# Patient Record
Sex: Male | Born: 1990 | Race: White | Hispanic: No | Marital: Single | Smoking: Never smoker
Health system: Southern US, Community
[De-identification: ages and names within clinical notes are randomized; demographics above are authoritative.]

## PROBLEM LIST (undated history)

## (undated) DIAGNOSIS — R569 Unspecified convulsions: Secondary | ICD-10-CM

## (undated) DIAGNOSIS — R51 Headache: Secondary | ICD-10-CM

## (undated) HISTORY — DX: Headache: R51

## (undated) HISTORY — DX: Unspecified convulsions: R56.9

---
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
## 2013-05-29 ENCOUNTER — Telehealth: Payer: Self-pay

## 2013-05-29 NOTE — Telephone Encounter (Signed)
Happy lvm stating that pt was going to be home from school on Thursday and Friday . After that, he will be gone again. Mom said they just received the DMV forms in the mail and would like to know if Inetta Fermo would fill them out. Please call mom at [phone].

## 2013-05-29 NOTE — Telephone Encounter (Signed)
Called mom and scheduled appt for Thursday at 8:45 am. Told her to be sure he brought the forms with him.

## 2013-05-29 NOTE — Telephone Encounter (Signed)
I will need to see Nathanuel to complete the [facility] form as I have not seen him since April 2012. I have 2 openings on Thursday, if he wants to come in at one of those times. Please check with Mom and see. Thanks, Inetta Fermo

## 2013-05-31 ENCOUNTER — Ambulatory Visit: Payer: Self-pay | Admitting: Family

## 2013-05-31 ENCOUNTER — Encounter: Payer: Self-pay | Admitting: Family

## 2013-05-31 ENCOUNTER — Ambulatory Visit (INDEPENDENT_AMBULATORY_CARE_PROVIDER_SITE_OTHER): Payer: BC Managed Care – PPO | Admitting: Family

## 2013-05-31 VITALS — BP 118/64 | HR 60 | Ht 72.0 in | Wt 151.8 lb

## 2013-05-31 DIAGNOSIS — A879 Viral meningitis, unspecified: Secondary | ICD-10-CM

## 2013-05-31 DIAGNOSIS — G40109 Localization-related (focal) (partial) symptomatic epilepsy and epileptic syndromes with simple partial seizures, not intractable, without status epilepticus: Secondary | ICD-10-CM

## 2013-05-31 DIAGNOSIS — G43809 Other migraine, not intractable, without status migrainosus: Secondary | ICD-10-CM
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
## 2013-06-01 ENCOUNTER — Encounter: Payer: Self-pay | Admitting: Family

## 2013-06-01 DIAGNOSIS — G40109 Localization-related (focal) (partial) symptomatic epilepsy and epileptic syndromes with simple partial seizures, not intractable, without status epilepticus: Secondary | ICD-10-CM | POA: Insufficient documentation

## 2013-06-01 DIAGNOSIS — G43809 Other migraine, not intractable, without status migrainosus: Secondary | ICD-10-CM | POA: Insufficient documentation

## 2013-06-01 DIAGNOSIS — A879 Viral meningitis, unspecified: Secondary | ICD-10-CM | POA: Insufficient documentation

## 2013-06-01 NOTE — Patient Instructions (Addendum)
I will complete your DMV form and send it to the [facility]. I will send a copy to you as well.  Plan to return for follow up when you receive another DMV form, or sooner if needed.

## 2013-11-28 ENCOUNTER — Telehealth: Payer: Self-pay

## 2013-11-28 NOTE — Telephone Encounter (Signed)
Please let Mom know that he can take any of the "new" allergy medications such as Claritin, Allegra, Zyrtec. Usually taking the medication at bedtime is best in case it makes him sleepy. Thanks, Inetta Fermoina

## 2013-11-28 NOTE — Telephone Encounter (Signed)
Bryce Wheeler, mom, lvm stating that pt normally gets allergies around this time of year. She wants to know what would be safe for him to take. She said that she knows not to give Benadryl. Please advise and I will call mom back at [phone].

## 2013-11-28 NOTE — Telephone Encounter (Signed)
Called mom and lvm letting her know the below information.

## 2021-07-12 ENCOUNTER — Encounter: Payer: Self-pay | Admitting: Emergency Medicine

## 2021-07-12 ENCOUNTER — Emergency Department (HOSPITAL_BASED_OUTPATIENT_CLINIC_OR_DEPARTMENT_OTHER)
Admission: EM | Admit: 2021-07-12 | Discharge: 2021-07-12 | Disposition: A | Discharge status: Home / Self Care | Payer: No Typology Code available for payment source | Attending: Emergency Medicine | Admitting: Emergency Medicine

## 2021-07-12 ENCOUNTER — Other Ambulatory Visit: Payer: Self-pay

## 2021-07-12 ENCOUNTER — Encounter (HOSPITAL_BASED_OUTPATIENT_CLINIC_OR_DEPARTMENT_OTHER): Payer: Self-pay

## 2021-07-12 DIAGNOSIS — K2100 Gastro-esophageal reflux disease with esophagitis, without bleeding: Secondary | ICD-10-CM | POA: Insufficient documentation

## 2021-07-12 DIAGNOSIS — R220 Localized swelling, mass and lump, head: Secondary | ICD-10-CM | POA: Diagnosis present

## 2021-07-12 DIAGNOSIS — R0989 Other specified symptoms and signs involving the circulatory and respiratory systems: Secondary | ICD-10-CM

## 2021-07-12 DIAGNOSIS — R569 Unspecified convulsions: Secondary | ICD-10-CM | POA: Insufficient documentation

## 2021-07-12 DIAGNOSIS — R198 Other specified symptoms and signs involving the digestive system and abdomen: Secondary | ICD-10-CM

## 2021-07-12 MED ORDER — OMEPRAZOLE 20 MG PO CPDR: 20.0000 mg | DELAYED_RELEASE_CAPSULE | Freq: Every day | ORAL | 2 refills | Status: DC

## 2021-07-12 MED ORDER — FAMOTIDINE 20 MG PO TABS: 20.0000 mg | ORAL_TABLET | Freq: Two times a day (BID) | ORAL | 2 refills | Status: DC

## 2021-07-12 NOTE — ED Triage Notes (Addendum)
Pt is present for increasing left sided throat swelling x two days. Pt has had this ongoing issue for appx one month where he will have "numbness and food gets stuck in his throat". Pt states it is not painful but is concerned about decreased sensation of the left side of his throat. Pt has also had fullness of the left ear. Denies difficulty breathing or difficulty swallowing.

## 2021-07-12 NOTE — Discharge Instructions (Addendum)
I recommended you schedule a follow up appointment with the GI (stomach) specialists for your significant reflux/heartburn.  This can be irritating your throat as well.  I placed a referral to their office - if you don't hear from them in 2 business days, call the number above for [facility] to schedule an appointment.  You should also schedule an appointment with the ENT doctors regarding your enlarged tonsils and chronic ear pressure.  We talked about changing your diet habits.  Try to avoid eating or drinking anything but water at least 3 hours before bedtime.  Try to dilute coffee with milk and drink only 1 cup per day.  Try to avoid very spicy food and alcohol, which can be a stomach irritant.

## 2021-07-12 NOTE — ED Provider Notes (Signed)
[facility] EMERGENCY DEPT Provider Note   CSN: [id number] Arrival date & time: 07/12/21  3976     History Chief Complaint  Patient presents with   Oral Swelling    Bryce Wheeler is a 30 y.o. male presenting to emergency department with complaint of throat discomfort.  The patient reports has been having this issue intermittently for many months.  He feels a get worse in the past 3 days.  He states he does suffer from significant reflux, often feels "acid washing up into the back of my mouth."  He says he intermittently gets a feeling that there is something stuck in the back of his throat, or numbness involving the left posterior side of his throat.  There is a feeling of something stuck when he swallows.  He denies choking, denies vomiting.  Report sometimes he gets a sensation of fullness in his left ear.  He does occasionally use chewing tobacco but has recently stopped.  He does have a history of seizures and is on 2 antiepileptics for that, and reports he has not had a seizure in many years.  He does not take any medications for his reflux aside from occasional Tums.  He does report that he drinks a lot of coffee, enjoys drinking beer as well, often snacks late at night before bedtime.  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 No pertinent surgical history.     No family history on file.  Social History   Tobacco Use   Smoking status: Never   Smokeless tobacco: Never  Substance Use Topics   Alcohol use: Yes    Comment: socially   Drug use: No    Home Medications Prior to  Admission medications   Medication Sig Start Date End Date Taking? Authorizing Provider  famotidine (PEPCID) 20 MG tablet Take 1 tablet (20 mg total) by mouth 2 (two) times daily. Take 30 minutes before breakfast and dinner 07/12/21 08/11/21 Yes Pelagia Iacobucci, Kermit Balo, MD  omeprazole (PRILOSEC) 20 MG capsule Take 1 capsule (20 mg total) by mouth daily. 07/12/21 08/11/21 Yes Richey Doolittle, Kermit Balo, MD    Allergies    Dilantin [phenytoin]  Review of Systems   Review of Systems  Constitutional:  Negative for chills and fever.  HENT:  Positive for ear pain, sore throat and trouble swallowing. Negative for ear discharge and voice change.   Respiratory:  Negative for cough and shortness of breath.   Cardiovascular:  Negative for chest pain.  Gastrointestinal:  Negative for abdominal pain, diarrhea, nausea and vomiting.  Skin:  Negative for color change and rash.  Neurological:  Negative for syncope and headaches.  All other systems reviewed and are negative.  Physical Exam Updated Vital Signs BP (!) 157/92 (BP Location: Right Arm)   Pulse 98   Temp 98.1 F (36.7 C) (Oral)   Resp 17   Ht 6' (1.829 m)   Wt 99.8 kg   SpO2 99%   BMI 29.84 kg/m   Physical Exam Constitutional:      General: He is not in acute distress. HENT:     Head:  Normocephalic and atraumatic.     Right Ear: Hearing, tympanic membrane, ear canal and external ear normal. Tympanic membrane is not perforated or erythematous.     Left Ear: Hearing, tympanic membrane, ear canal and external ear normal. Tympanic membrane is not perforated or erythematous.     Mouth/Throat:     Lips: Pink.     Mouth: Mucous membranes are moist.     Dentition: Normal dentition.     Tongue: No lesions.     Palate: No mass and lesions.     Pharynx: Oropharynx is clear. Uvula midline.     Tonsils: No tonsillar exudate or tonsillar abscesses. 3+ on the right. 3+ on the left.  Eyes:     Extraocular Movements: Extraocular movements intact.      Conjunctiva/sclera: Conjunctivae normal.     Pupils: Pupils are equal, round, and reactive to light.  Neck:     Thyroid: No thyroid mass.  Cardiovascular:     Rate and Rhythm: Normal rate and regular rhythm.  Pulmonary:     Effort: Pulmonary effort is normal. No respiratory distress.     Breath sounds: No stridor.  Musculoskeletal:     Cervical back: Neck supple. No rigidity or tenderness.  Lymphadenopathy:     Cervical: No cervical adenopathy.  Skin:    General: Skin is warm and dry.  Neurological:     General: No focal deficit present.     Mental Status: He is alert. Mental status is at baseline.  Psychiatric:        Mood and Affect: Mood normal.        Behavior: Behavior normal.    ED Results / Procedures / Treatments   Labs (all labs ordered are listed, but only abnormal results are displayed) Labs Reviewed - No data to display  EKG None  Radiology No results found.  Procedures Procedures   Medications Ordered in ED Medications - No data to display  ED Course  I have reviewed the triage vital signs and the nursing notes.  Pertinent labs & imaging results that were available during my care of the patient were reviewed by me and considered in my medical decision making (see chart for details).  Differential diagnosis includes reflux gastritis and esophagitis is most likely cause versus globus sensation versus viral illness vs other  I do not see evidence of peritonsillar abscess or Ludwick's angina.  Uvula is midline.  He does have enlarged tonsils bilaterally, which I suspect is chronic.  He reports that he snores at night.  I suspect there is probably some obstructive apnea as well.  Given that he also has chronic ear pain and congestion, I advised that he see an ENT specialist for this issue.    I do not see any oropharyngeal lesions to suggest that there is this cancerous, but strongly advised he refrain from chewing tobacco.  Did not see evidence of acute ear  infection.  I suggested antihistamines if he continues to have a sensation of ear fullness.  Finally, we talked about his gastritis.  He talked about dietary changes and lifestyle changes.  Given the degree of discomfort that he has on daily basis, I do think it is reasonable to initiate medications, I will start him on omeprazole as well as Pepcid.  I will place referral to gastroenterology, as he may benefit from endoscopy and H Pylori testing if this is persistent.  He verbalized understanding and agreement with this plan.    Final Clinical Impression(s) /  ED Diagnoses Final diagnoses:  Globus sensation  Gastroesophageal reflux disease with esophagitis without hemorrhage    Rx / DC Orders ED Discharge Orders          Ordered    Ambulatory referral to Gastroenterology       Comments: Significant reflux gastritis & esophagitis   07/12/21 0718    omeprazole (PRILOSEC) 20 MG capsule  Daily        07/12/21 0720    famotidine (PEPCID) 20 MG tablet  2 times daily        07/12/21 0720             Terald Sleeper, MD 07/12/21 [phone]

## 2021-07-16 ENCOUNTER — Encounter: Payer: Self-pay | Admitting: Gastroenterology

## 2021-08-14 ENCOUNTER — Ambulatory Visit: Payer: No Typology Code available for payment source | Admitting: Gastroenterology

## 2021-08-18 ENCOUNTER — Encounter: Payer: Self-pay | Admitting: Gastroenterology

## 2021-08-18 ENCOUNTER — Ambulatory Visit: Payer: No Typology Code available for payment source | Admitting: Gastroenterology

## 2021-08-18 VITALS — BP 140/88 | HR 81 | Ht 72.0 in | Wt 240.0 lb

## 2021-08-18 DIAGNOSIS — K219 Gastro-esophageal reflux disease without esophagitis: Secondary | ICD-10-CM | POA: Diagnosis not present

## 2021-08-18 DIAGNOSIS — R0989 Other specified symptoms and signs involving the circulatory and respiratory systems: Secondary | ICD-10-CM

## 2021-08-18 NOTE — Patient Instructions (Addendum)
If you are age 28 or older, your body mass index should be between 23-30. Your Body mass index is 32.55 kg/m. If this is out of the aforementioned range listed, please consider follow up with your Primary Care Provider.  If you are age 79 or younger, your body mass index should be between 19-25. Your Body mass index is 32.55 kg/m. If this is out of the aformentioned range listed, please consider follow up with your Primary Care Provider.   ________________________________________________________  The [facility] providers would like to encourage you to use [facility] to communicate with providers for non-urgent requests or questions.  Due to long hold times on the telephone, sending your provider a message by [facility] may be a faster and more efficient way to get a response.  Please allow 48 business hours for a response.  Please remember that this is for non-urgent requests.  _______________________________________________________  Bonita Quin have been scheduled for an endoscopy. Please follow written instructions given to you at your visit today. If you use inhalers (even only as needed), please bring them with you on the day of your procedure.  Please stop the famotidine.     It was a pleasure to see you today!  Thank you for trusting me with your gastrointestinal care!

## 2021-08-18 NOTE — Progress Notes (Signed)
[facility] Consult Note:  History: Bryce Wheeler 08/18/2021  Referring provider: Pcp, No  Reason for consult/chief complaint: Heartburn (Had a procedure at his ENT)   Subjective  HPI: [facility]21: "Bryce Wheeler is a 30 y.o. male presenting to emergency department with complaint of throat discomfort.  The patient reports has been having this issue intermittently for many months.  He feels a get worse in the past 3 days.  He states he does suffer from significant reflux, often feels "acid washing up into the back of my mouth."  He says he intermittently gets a feeling that there is something stuck in the back of his throat, or numbness involving the left posterior side of his throat.  There is a feeling of something stuck when he swallows.  He denies choking, denies vomiting.  Report sometimes he gets a sensation of fullness in his left ear.   He does occasionally use chewing tobacco but has recently stopped.   He does have a history of seizures and is on 2 antiepileptics for that, and reports he has not had a seizure in many years.   He does not take any medications for his reflux aside from occasional Tums.  He does report that he drinks a lot of coffee, enjoys drinking beer as well, often snacks late at night before bedtime."  Exam that they reportedly normal except for enlarged tonsils.  Patient was referred to ENT, was seen by Jodelle Redharlotta Nordbladh, [facility].  Diagnosis was LPR, laryngoscopy performed with following findings: "After adequate topical anesthetic was applied, 4 mm flexible laryngoscope was passed through the left nasal cavity without difficulty. Flexible laryngoscopy shows patent anterior nasal cavity with minimal crusting, no discharge or infection. Cobblestoning of the nasopharyngeal mucosa.  Normal base of tongue and supraglottis Normal vocal cord mobility without vocal cord  nodule, mass, polyp or tumor. Hypopharynx normal without mass, pooling of secretions or aspiration. Mild post-glottic edema otherwise unremarkable exam. " ________________________________  Bryce Wheeler describes about 2 years of a nonexertional central dull chest pain that he believes may be GERD related.  It typically occurs in the evening after a long day of work, often when he has eaten very little most of the day because he is busy at work.  Pain right radiate to the back sometimes posterior neck.  It is often on an empty stomach, he has occasional regurgitation of bile or liquid stomach contents.  There is a feeling of lump in the throat that has not improved much despite improvement in the chest discomfort starting omeprazole.  About 2 years ago he went to this job working for the [employer] department in [place], he has an irregular schedule and works long hours, has fast food more often than he would like, eats late in the evening and has gained a significant amount of weight by his estimation. He denies dysphagia or odynophagia, nausea or vomiting.  After seeing ENT, he redosed the PPI from morning to evening and again, there has been significant improvement in the chest pain with that.   ROS:  Review of Systems  Constitutional:  Negative for appetite change and unexpected weight change.  HENT:  Negative for mouth sores and voice change.   Eyes:  Negative for pain and redness.  Respiratory:  Negative for cough and shortness of breath.   Cardiovascular:  Positive for chest pain. Negative for palpitations.  Genitourinary:  Negative for dysuria and  hematuria.  Musculoskeletal:  Negative for arthralgias and myalgias.  Skin:  Negative for pallor and rash.  Neurological:  Negative for weakness and headaches.  Hematological:  Negative for adenopathy.    Past Medical History: Past Medical History:  Diagnosis Date   Headache(784.0)    Seizures ([place])      Past Surgical History: History reviewed.  No pertinent surgical history.   Family History: History reviewed. No pertinent family history.  Social History: Social History   Socioeconomic History   Marital status: Single    Spouse name: Not on file   Number of children: Not on file   Years of education: Not on file   Highest education level: Not on file  Occupational History   Not on file  Tobacco Use   Smoking status: Never   Smokeless tobacco: Never  Substance and Sexual Activity   Alcohol use: Yes    Comment: socially   Drug use: No   Sexual activity: Not on file  Other Topics Concern   Not on file  Social History Narrative   Not on file   Social Determinants of Health   Financial Resource Strain: Not on file  Food Insecurity: Not on file  Transportation Needs: Not on file  Physical Activity: Not on file  Stress: Not on file  Social Connections: Not on file    Allergies: Allergies  Allergen Reactions   Dilantin [Phenytoin] Rash    Outpatient Meds: Current Outpatient Medications  Medication Sig Dispense Refill   Lacosamide 100 MG TABS Take by mouth.     lamoTRIgine (LAMICTAL) 200 MG tablet Take by mouth.     famotidine (PEPCID) 20 MG tablet Take 1 tablet (20 mg total) by mouth 2 (two) times daily. Take 30 minutes before breakfast and dinner 60 tablet 2   omeprazole (PRILOSEC) 20 MG capsule Take 1 capsule (20 mg total) by mouth daily. 30 capsule 2   No current facility-administered medications for this visit.      ___________________________________________________________________ Objective   Exam:  BP 140/88   Pulse 81   Ht 6' (1.829 m)   Wt 240 lb (108.9 kg)   SpO2 98%   BMI 32.55 kg/m  Wt Readings from Last 3 Encounters:  08/18/21 240 lb (108.9 kg)  07/12/21 220 lb (99.8 kg)  05/31/13 151 lb 12.8 oz (68.9 kg)    General: Well-appearing, normal vocal quality Eyes: sclera anicteric, no redness ENT: oral mucosa moist without lesions, no cervical or supraclavicular  lymphadenopathy CV: RRR without murmur, S1/S2, no JVD, no peripheral edema Resp: clear to auscultation bilaterally, normal RR and effort noted GI: soft, no tenderness, with active bowel sounds. No guarding or palpable organomegaly noted. Skin; warm and dry, no rash or jaundice noted Neuro: awake, alert and oriented x 3. Normal gross motor function and fluent speech  Labs:  No recent data for review Assessment: Encounter Diagnoses  Name Primary?   Gastroesophageal reflux disease, unspecified whether esophagitis present Yes   Globus sensation     Chest pain that may be reflux related, he does have intermittent regurgitation and some globus sensation.  He wonders if he has developed a "increased awareness" of the throat given the duration of the symptoms, which may be the case.  He does not have dysphagia to suggest EOE.  Symptoms improved on low-dose PPI, indicating GERD may very well be the cause.  Sounds like there are significant diet and lifestyle changes that he is aware of and wants to work hard  on, but his demanding job makes that difficult. We discussed the limitation of acid suppression therapy, breadth of diet and lifestyle changes required for reflux control, the possibility of reflux related complications such as esophagitis, stricture or Barrett's esophagus.  Other anatomic considerations such as gastric outlet obstruction or hiatal hernia may contribute to reflux.  Plan:  Continue omeprazole 20 mg with evening meal GERD diet and lifestyle measures reviewed Discontinue famotidine Upper endoscopy.  He was agreeable after discussion of procedure and risks  The benefits and risks of the planned procedure were described in detail with the patient or (when appropriate) their health care proxy.  Risks were outlined as including, but not limited to, bleeding, infection, perforation, adverse medication reaction leading to cardiac or pulmonary decompensation, pancreatitis (if ERCP).  The  limitation of incomplete mucosal visualization was also discussed.  No guarantees or warranties were given.    Thank you for the courtesy of this consult.  Please call me with any questions or concerns.  Charlie Pitter III  CC: Referring provider noted above

## 2021-09-07 ENCOUNTER — Other Ambulatory Visit: Payer: Self-pay | Admitting: Gastroenterology

## 2021-09-07 ENCOUNTER — Ambulatory Visit (AMBULATORY_SURGERY_CENTER): Payer: No Typology Code available for payment source | Admitting: Gastroenterology

## 2021-09-07 ENCOUNTER — Encounter: Payer: Self-pay | Admitting: Gastroenterology

## 2021-09-07 VITALS — BP 134/72 | HR 73 | Temp 98.9°F | Resp 16 | Ht 72.0 in | Wt 240.0 lb

## 2021-09-07 DIAGNOSIS — K200 Eosinophilic esophagitis: Secondary | ICD-10-CM

## 2021-09-07 DIAGNOSIS — K219 Gastro-esophageal reflux disease without esophagitis: Secondary | ICD-10-CM

## 2021-09-07 DIAGNOSIS — K2289 Other specified disease of esophagus: Secondary | ICD-10-CM | POA: Diagnosis not present

## 2021-09-07 MED ORDER — SODIUM CHLORIDE 0.9 % IV SOLN: 500.0000 mL | Freq: Once | INTRAVENOUS | Status: DC

## 2021-09-07 NOTE — Op Note (Signed)
[facility] Patient Name: Bryce Wheeler Procedure Date: 09/07/2021 9:59 AM MRN: [id number] Endoscopist: Sherilyn Cooter L. Myrtie Neither , MD Age: 30 Referring MD:  Date of Birth: 31-Jul-1991 Gender: Male Account #: [id number] Procedure:                Upper GI endoscopy Indications:              Esophageal reflux symptoms that persist despite                            appropriate therapy, also throat discomfort                           recent office consult note with clinical details                           no dysphagia Medicines:                Monitored Anesthesia Care Procedure:                Pre-Anesthesia Assessment:                           - Prior to the procedure, a History and Physical                            was performed, and patient medications and                            allergies were reviewed. The patient's tolerance of                            previous anesthesia was also reviewed. The risks                            and benefits of the procedure and the sedation                            options and risks were discussed with the patient.                            All questions were answered, and informed consent                            was obtained. Prior Anticoagulants: The patient has                            taken no previous anticoagulant or antiplatelet                            agents. ASA Grade Assessment: II - A patient with                            mild systemic disease. After reviewing the risks  and benefits, the patient was deemed in                            satisfactory condition to undergo the procedure.                           After obtaining informed consent, the endoscope was                            passed under direct vision. Throughout the                            procedure, the patient's blood pressure, pulse, and                            oxygen saturations were monitored continuously. The                             #4314 Olympus Endoscope was introduced through the                            mouth, and advanced to the second part of duodenum.                            The upper GI endoscopy was accomplished without                            difficulty. The patient tolerated the procedure                            well. Scope In: Scope Out: Findings:                 Diffuse mucosal changes characterized by                            longitudinal markings were found in the middle                            third of the esophagus and in the lower third of                            the esophagus. Several biopsies were obtained in                            the middle third of the esophagus and in the lower                            third of the esophagus with cold forceps for                            histology.                           The stomach was normal.  The cardia and gastric fundus were normal on                            retroflexion.                           The examined duodenum was normal. Complications:            No immediate complications. Estimated Blood Loss:     Estimated blood loss was minimal. Impression:               - Longitudinally marked mucosa in the esophagus.                           - Normal stomach.                           - Normal examined duodenum.                           - Several biopsies were obtained in the middle                            third of the esophagus and in the lower third of                            the esophagus. Recommendation:           - Patient has a contact number available for                            emergencies. The signs and symptoms of potential                            delayed complications were discussed with the                            patient. Return to normal activities tomorrow.                            Written discharge instructions were provided to the                             patient.                           - Resume previous diet.                           - Continue present medications.                           - Await pathology results. (r/o EoE vs GERD changes)                           - Return to my office at appointment to be  scheduled. Virgilene Stryker L. Myrtie Neither, MD 09/07/2021 10:23:46 AM This report has been signed electronically.

## 2021-09-07 NOTE — Patient Instructions (Signed)
Thank you for allowing [place] to care for you today. Resume previous diet and medications today.  Return to normal daily activities tomorrow/ Biopsy results will be available in 7-10 days.  Will make necessary recommendations at that time. Follow up in the office, appointment to be made in the coming days, we will call you.     YOU HAD AN ENDOSCOPIC PROCEDURE TODAY AT THE [facility]:   Refer to the procedure report that was given to you for any specific questions about what was found during the examination.  If the procedure report does not answer your questions, please call your gastroenterologist to clarify.  If you requested that your care partner not be given the details of your procedure findings, then the procedure report has been included in a sealed envelope for you to review at your convenience later.  YOU SHOULD EXPECT: Some feelings of bloating in the abdomen. Passage of more gas than usual.  Walking can help get rid of the air that was put into your GI tract during the procedure and reduce the bloating. If you had a lower endoscopy (such as a colonoscopy or flexible sigmoidoscopy) you may notice spotting of blood in your stool or on the toilet paper. If you underwent a bowel prep for your procedure, you may not have a normal bowel movement for a few days.  Please Note:  You might notice some irritation and congestion in your nose or some drainage.  This is from the oxygen used during your procedure.  There is no need for concern and it should clear up in a day or so.  SYMPTOMS TO REPORT IMMEDIATELY:    Following upper endoscopy (EGD)  Vomiting of blood or coffee ground material  New chest pain or pain under the shoulder blades  Painful or persistently difficult swallowing  New shortness of breath  Fever of 100F or higher  Black, tarry-looking stools  For urgent or emergent issues, a gastroenterologist can be reached at any hour by calling [phone]. Do not use  MyChart messaging for urgent concerns.    DIET:  We do recommend a small meal at first, but then you may proceed to your regular diet.  Drink plenty of fluids but you should avoid alcoholic beverages for 24 hours.  ACTIVITY:  You should plan to take it easy for the rest of today and you should NOT DRIVE or use heavy machinery until tomorrow (because of the sedation medicines used during the test).    FOLLOW UP: Our staff will call the number listed on your records 48-72 hours following your procedure to check on you and address any questions or concerns that you may have regarding the information given to you following your procedure. If we do not reach you, we will leave a message.  We will attempt to reach you two times.  During this call, we will ask if you have developed any symptoms of COVID 19. If you develop any symptoms (ie: fever, flu-like symptoms, shortness of breath, cough etc.) before then, please call [phone].  If you test positive for Covid 19 in the 2 weeks post procedure, please call and report this information to [place].    If any biopsies were taken you will be contacted by phone or by letter within the next 1-3 weeks.  Please call us at [phone] if you have not heard about the biopsies in 3 weeks.    SIGNATURES/CONFIDENTIALITY: You and/or your care partner have signed paperwork which  will be entered into your electronic medical record.  These signatures attest to the fact that that the information above on your After Visit Summary has been reviewed and is understood.  Full responsibility of the confidentiality of this discharge information lies with you and/or your care-partner.

## 2021-09-07 NOTE — Progress Notes (Signed)
Called to room to assist during endoscopic procedure.  Patient ID and intended procedure confirmed with present staff. Received instructions for my participation in the procedure from the performing physician.  

## 2021-09-07 NOTE — Progress Notes (Signed)
No changes to clinical history since GI office visit on 08/18/21.  The patient is appropriate for an endoscopic procedure in the ambulatory setting.

## 2021-09-07 NOTE — Progress Notes (Signed)
Report to PACU, RN, vss, BBS= Clear.  

## 2021-09-08 ENCOUNTER — Telehealth: Payer: Self-pay

## 2021-09-08 NOTE — Telephone Encounter (Signed)
Per 09/07/21 procedure report - Return to my office at appt to be scheduled  Attempted to reach patient twice at the listed home number. The line goes straight to vm and his vm is full and can't accept any new messages at this time. Unable to leave a vm. Will attempt to reach patient again.

## 2021-09-09 ENCOUNTER — Telehealth: Payer: Self-pay | Admitting: *Deleted

## 2021-09-09 NOTE — Telephone Encounter (Signed)
First attempt, mailbox is full and unable to leave message.  

## 2021-09-15 NOTE — Telephone Encounter (Signed)
Called and spoke with pt. He is scheduled for a follow up with Dr. Myrtie Neither on Wednesday 10/21/21 at 10 am. Pt verbalized understanding and had no other concerns.

## 2021-09-16 ENCOUNTER — Other Ambulatory Visit: Payer: Self-pay

## 2021-09-16 MED ORDER — OMEPRAZOLE 40 MG PO CPDR: DELAYED_RELEASE_CAPSULE | ORAL | 1 refills | Status: DC

## 2021-10-21 ENCOUNTER — Ambulatory Visit: Payer: No Typology Code available for payment source | Admitting: Gastroenterology

## 2021-10-21 ENCOUNTER — Encounter: Payer: Self-pay | Admitting: Gastroenterology

## 2021-10-21 VITALS — BP 144/88 | HR 84 | Ht 72.0 in | Wt 244.0 lb

## 2021-10-21 DIAGNOSIS — K219 Gastro-esophageal reflux disease without esophagitis: Secondary | ICD-10-CM | POA: Diagnosis not present

## 2021-10-21 NOTE — Progress Notes (Signed)
[facility] Progress Note  Chief Complaint: GERD  Subjective  History: November clinic consult for throat discomfort and intermittent reflux symptoms, previous ENT evaluation.  No dysphagia.  Upper endoscopy 09/07/2021 with mid to distal esophageal mucosal changes suggestive of either GERD or EOE.  Results as below, increased eosinophils, no reported crypt elongation or other changes of reflux.  Patient then placed on omeprazole 40 mg twice daily.  Bryce Wheeler is glad to report he is feeling better since his procedure.  On twice daily omeprazole his chest pain (which typically bothered him in the evening) has resolved.  He still has globus sensation.  Still denies dysphagia or odynophagia.  ROS: Cardiovascular:  no chest pain Respiratory: no dyspnea  The patient's Past Medical, Family and Social History were reviewed and are on file in the EMR.  Objective:  Med list reviewed  Current Outpatient Medications:    Lacosamide 100 MG TABS, Take by mouth., Disp: , Rfl:    lamoTRIgine (LAMICTAL) 200 MG tablet, Take by mouth., Disp: , Rfl:    omeprazole (PRILOSEC) 40 MG capsule, Take 1 capsule (40 mg total) by mouth 30-60 minutes before breakfast and supper, Disp: 60 capsule, Rfl: 1   Vital signs in last 24 hrs: Vitals:   10/21/21 1006  BP: (!) 144/88  Pulse: 84   Wt Readings from Last 3 Encounters:  10/21/21 244 lb (110.7 kg)  09/07/21 240 lb (108.9 kg)  08/18/21 240 lb (108.9 kg)    Physical Exam  Looks well.  No additional exam.  Entire visit spent in record review and discussion  Labs:   ___________________________________________ Radiologic studies:   ____________________________________________ Other: 1. Surgical [P], distal esophagus - SQUAMOUS MUCOSA WITH FOCALLY INCREASED INTRAEPITHELIAL EOSINOPHILS. - FOCALLY GREATER THAN 20 PER HIGH-POWER FIELD. 2. Surgical [P], mid esophagus - SQUAMOUS MUCOSA WITH FOCAL INTRAEPITHELIAL EOSINOPHILS. - FOCALLY UP TO 17  PER HIGH-POWER FIELD.  _____________________________________________ Assessment & Plan  Assessment: Encounter Diagnosis  Name Primary?   Gastroesophageal reflux disease without esophagitis Yes   GERD with nonerosive mucosal changes.  While there was an eosinophilic infiltrate, I think the reported symptoms and overall clinical picture favors GERD rather than EOE without dysphagia.  In addition to reiterating the typical diet and lifestyle measures, I discussed possibility of more definitive therapy such as a TIF (since he does not have hiatal hernia), and the need for pH/impedance and manometry testing if that were to be considered.  He was interested, but would like to focus on weight loss to see how much that might improve his symptoms.  He noted onset of the symptoms when he took this job a couple years ago leading to long hours and irregular schedule and weight gain, and he thinks he may be able to turn things around with more effort.  I agree with that goal, and recommended he decrease the omeprazole to 40 mg once a day before evening meal.  If he is successful at his diet lifestyle changes, then I would like him to decrease omeprazole to the 20 mg OTC dose once daily and then ultimately try to discontinue it altogether.  If he is unable to do so due to recurrent symptoms, then I encouraged him to contact me by portal with questions or concerns and to see me in the office if needed, at which time we could consider the pH/impedance testing to determine candidacy for TIF procedure.  22 minutes were spent on this encounter (including chart review, history/exam, counseling/coordination of care,  and documentation) > 50% of that time was spent on counseling and coordination of care.   Nelida Meuse III

## 2021-10-21 NOTE — Patient Instructions (Signed)
If you are age 30 or older, your body mass index should be between 23-30. Your Body mass index is 33.09 kg/m. If this is out of the aforementioned range listed, please consider follow up with your Primary Care Provider.  If you are age 23 or younger, your body mass index should be between 19-25. Your Body mass index is 33.09 kg/m. If this is out of the aformentioned range listed, please consider follow up with your Primary Care Provider.   ________________________________________________________  The [facility] providers would like to encourage you to use [facility] to communicate with providers for non-urgent requests or questions.  Due to long hold times on the telephone, sending your provider a message by [facility] may be a faster and more efficient way to get a response.  Please allow 48 business hours for a response.  Please remember that this is for non-urgent requests.  _______________________________________________________  It was a pleasure to see you today!  Thank you for trusting me with your gastrointestinal care!

## 2021-12-09 ENCOUNTER — Other Ambulatory Visit: Payer: Self-pay | Admitting: Gastroenterology

## 2022-03-04 ENCOUNTER — Other Ambulatory Visit: Payer: Self-pay | Admitting: Gastroenterology

## 2022-06-06 ENCOUNTER — Other Ambulatory Visit: Payer: Self-pay | Admitting: Gastroenterology
